# Patient Record
Sex: Male | Born: 1970 | Race: White | Hispanic: No | Marital: Married
Health system: Southern US, Community
[De-identification: ages and names within clinical notes are randomized; demographics above are authoritative.]

---

## 2007-09-19 ENCOUNTER — Inpatient Hospital Stay (HOSPITAL_COMMUNITY): Admission: RE | Admit: 2007-09-19 | Discharge: 2007-09-22 | Payer: Self-pay | Admitting: Orthopedic Surgery

## 2007-09-19 IMAGING — CR DG OS CALCIS 2+V*R*
3 series · 3 of 3 positions shown · non-contrast
Comparison: Spot views from plate and screw placement [DATE].

CLINICAL DATA: Calcaneus fracture.  Status post fixation.

RIGHT OS CALCIS - 2+ VIEW

[foot lat]
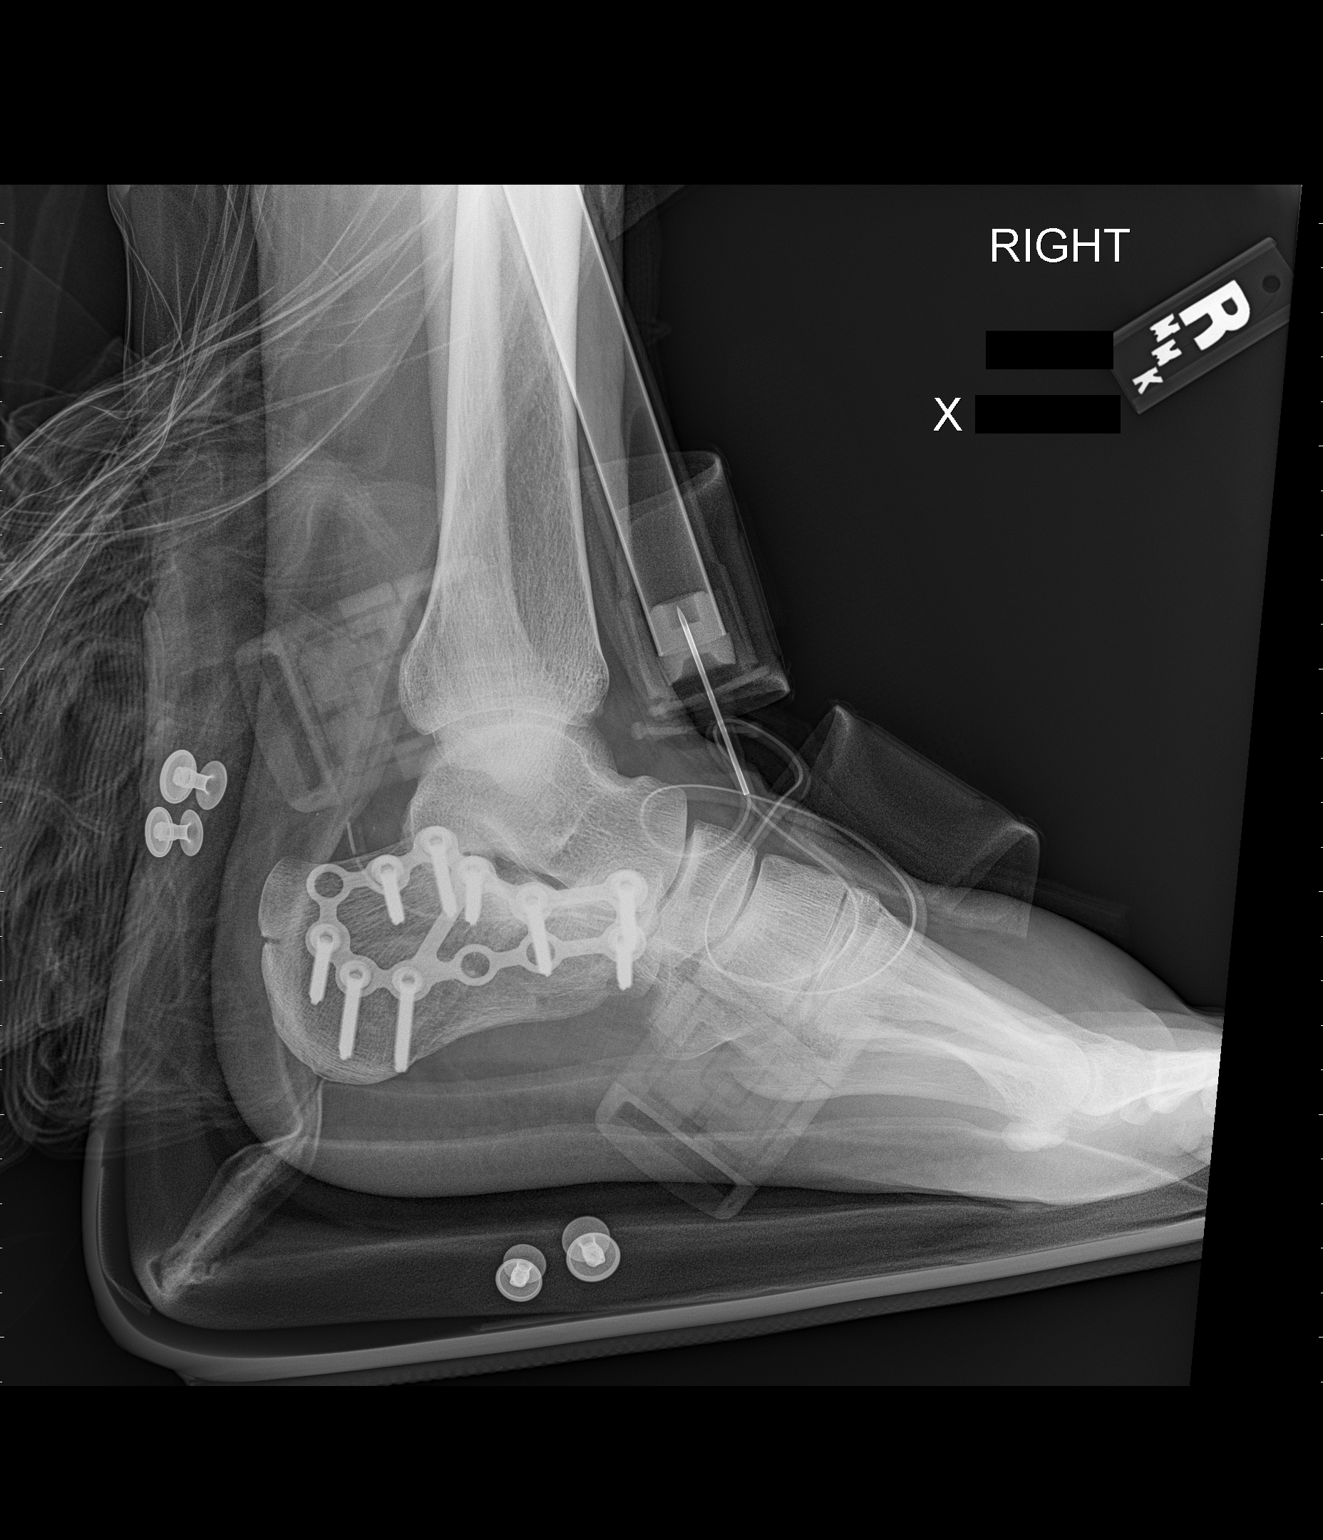

[AP (1 of 2)]
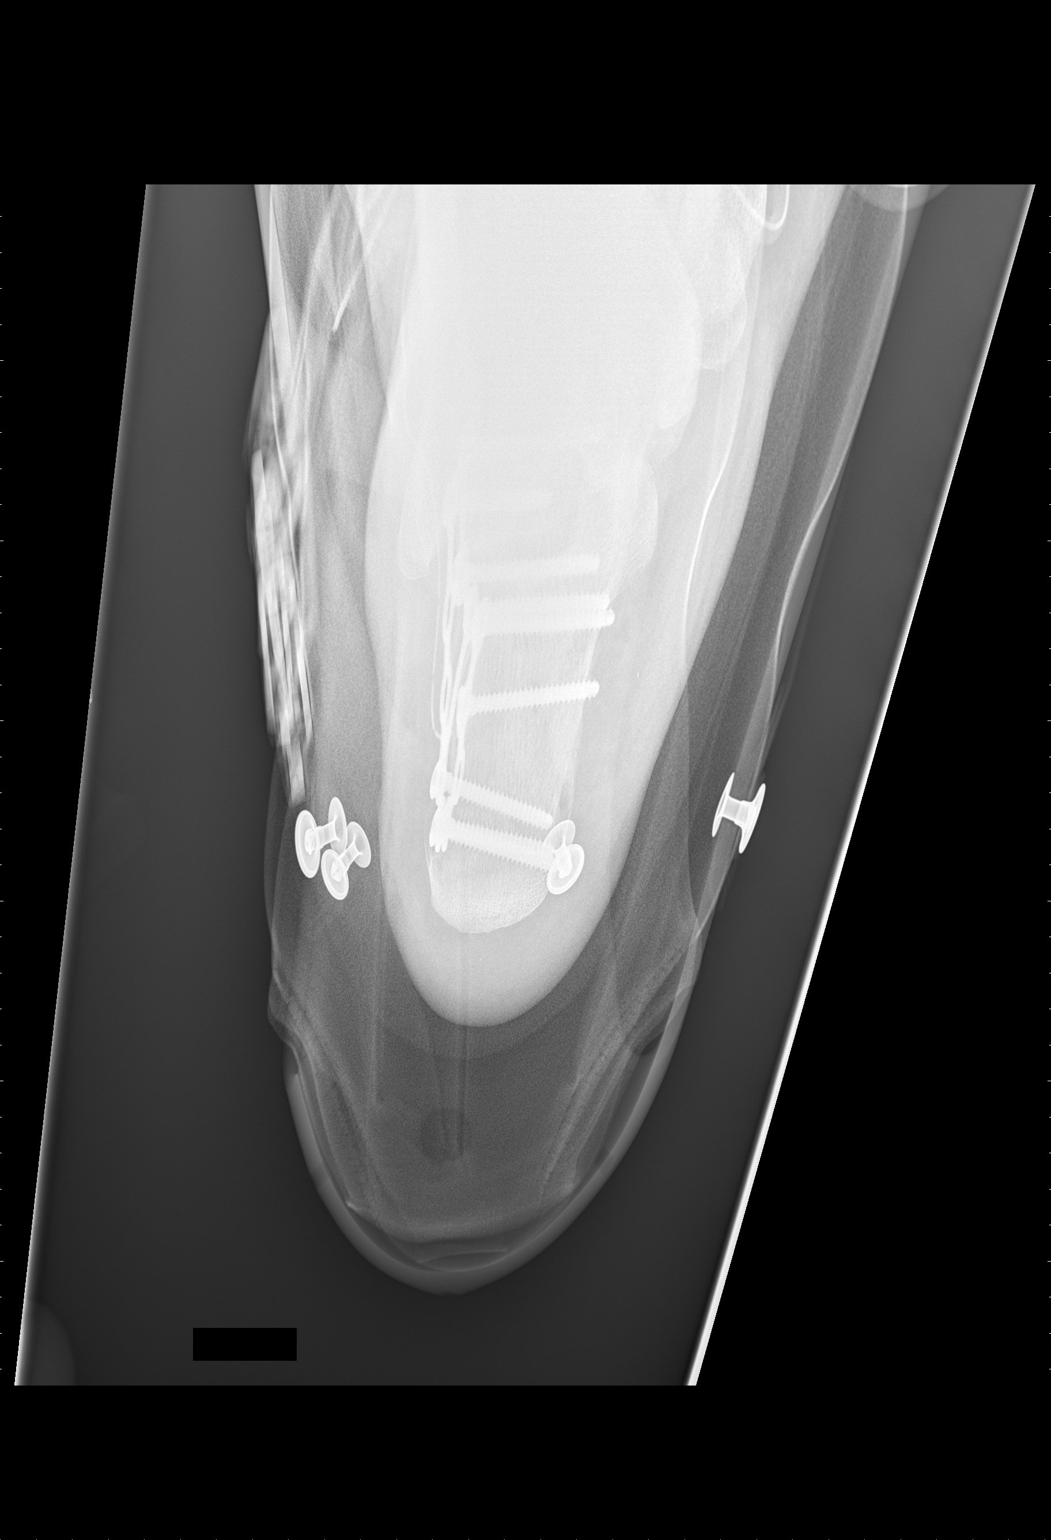

[AP (2 of 2)]
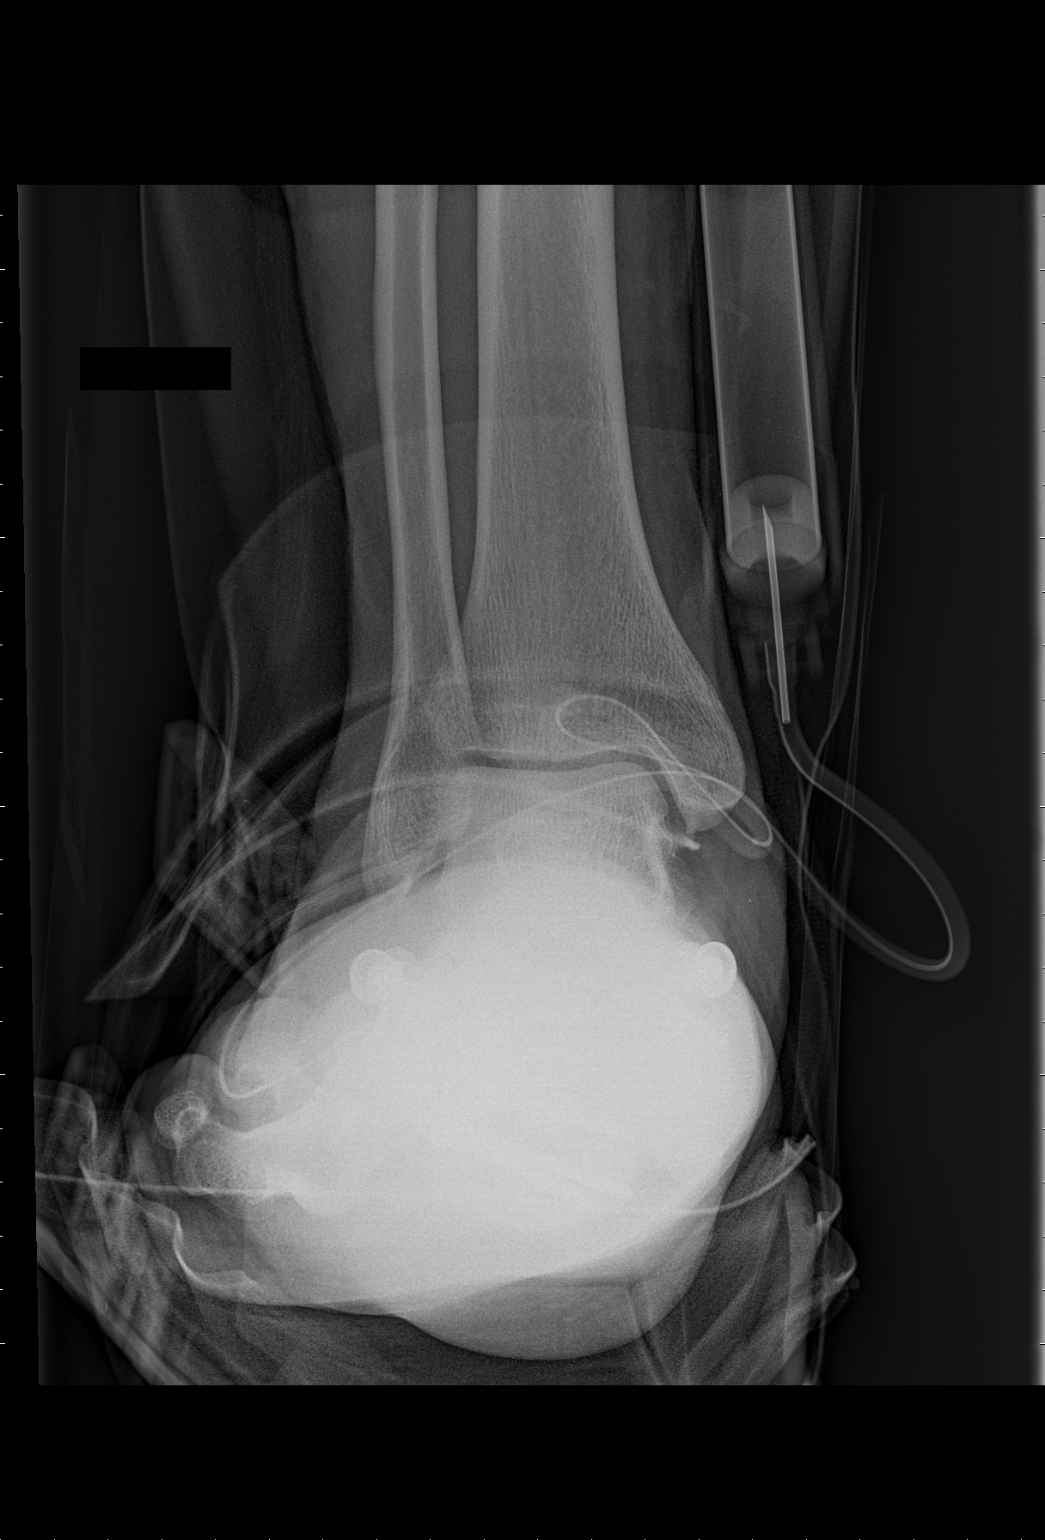

[3 of 3 positions shown; findings below may reference images not displayed]

FINDINGS: Lateral plate and screws are seen fixing a calcaneus
fracture.  No hardware complication is identified and position and
alignment appear near anatomical.  No new abnormality is seen.
IMPRESSION: Status post ORIF of calcaneus fracture.  No complicating features.

## 2008-08-02 ENCOUNTER — Encounter: Admission: RE | Admit: 2008-08-02 | Discharge: 2008-08-02 | Payer: Self-pay | Admitting: Orthopedic Surgery

## 2008-12-13 ENCOUNTER — Encounter: Admission: RE | Admit: 2008-12-13 | Discharge: 2008-12-13 | Payer: Self-pay | Admitting: Orthopedic Surgery

## 2009-04-17 ENCOUNTER — Encounter: Admission: RE | Admit: 2009-04-17 | Discharge: 2009-04-17 | Payer: Self-pay | Admitting: Orthopedic Surgery

## 2009-10-21 ENCOUNTER — Encounter: Admission: RE | Admit: 2009-10-21 | Discharge: 2009-10-21 | Payer: Self-pay | Admitting: Orthopedic Surgery

## 2010-09-15 NOTE — Discharge Summary (Signed)
NAME:  Dylan Cruz, Dylan Cruz NO.:  [id number]   MEDICAL RECORD NO.:  [id number]          PATIENT TYPE:  INP   LOCATION:  5015                         FACILITY:  [facility]   PHYSICIAN:  Doralee Albino. Carola Frost, M.D. DATE OF BIRTH:  03-03-1971   DATE OF ADMISSION:  09/19/2007  DATE OF DISCHARGE:  09/22/2007                               DISCHARGE SUMMARY   DISCHARGE DIAGNOSIS:  Right interarticular calcaneus fracture.   ADDITIONAL DIAGNOSIS:  Ulcerative colitis.   PROCEDURES PERFORMED:  On Sep 19, 2007:  Open reduction internal  fixation of right calcaneus.   BRIEF HISTORY AND HOSPITAL COURSE:  Dylan Cruz is a very pleasant 40-year-  old Caucasian male who fell off of a ladder while at work on Sep 06, 2007.  The patient was initially seen at [facility] and was  diagnosed with a comminuted right calcaneus fracture.  Given the  severity and complexity of the fracture, Dylan Cruz was referred to an  orthopedic trauma specialist, Dr. Carola Frost, for definitive management.  Prior to surgery, Dylan Cruz had a CT scan done on an outpatient basis,  which demonstrated intraarticular joint depressed-type fracture of the  right calcaneus.  Dylan Cruz was informed of the risks and benefits  associated with surgical intervention and elected to proceed with  surgical fixation of his right calcaneus fracture.   Dylan Cruz was brought to the operating room on Sep 19, 2007, where he  underwent open reduction internal fixation of his right calcaneus and he  tolerated the procedure well without any complications.  After the  procedure, the patient was placed in a night splint to maintain a  neutral position of his right ankle.  In addition, Dylan Cruz also had a  drain placed in the surgical wound to assist with any fluid collection.  Again, Dylan Cruz tolerated the procedure exceptionally well and his  total hospital stay was 3 days in length.  Dylan Cruz was admitted to the  orthopedic floor for pain  control and observation after his surgical  procedure.  The patient had pain that was well controlled with use of a  PCA, muscle relaxants, and p.o. pain medication.  He was up with therapy  on postoperative day 1 and tolerated therapy well, but continued to have  pain that required IV medications to obtain relief.  This persisted into  postoperative day #2, but the patient endured gradual decreases in pain  throughout the day on postoperative day 2.  The patient was more  ambulatory with therapy and on his own on postoperative day #2.  Therefore, on postoperative day #3, the patient was deemed stable from a  pain perspective, as well as with stable vital signs and a stable  physical exam to be discharged to home.  Of note, on postoperative day  #2, the patient's surgical dressing was taken down and the wound was  examined, as well as discontinuing the drain.  The surgical wound was  then re-dressed with adaptic, gauze, Kerlix, and an Ace wrap, and was  placed back into the night splint in a  neutral position.   Dylan Cruz hospital stay was relatively uneventful and was discharged  to home postoperative day #3.   DISCHARGE MEDICINES:  1. Percocet 10/325 one to two every 4 to 6 hours as needed for pain.  2. Oxycodone 15 mg 1/2 tab every 6 hours between Percocet doses for      breakthrough pain.  3. Robaxin 500 mg 1 to 2 every 6 hours as needed.  4. Lovenox 40 mg 1 subcutaneous injection daily.  5. Hydroxyzine 25 mg 1 p.o. q.6h as needed for itching.   The patient may also resume home medications as follows.  1. Lialda 1.5 mg 2 p.o. q. a.m.  2. Prilosec over-the-counter p.o. p.r.n.   Dylan Cruz will stop taking his hydrocodone 5/500 as he has been given  prescriptions for other narcotic medications.   DISCHARGE PHYSICAL EXAM:  The patient is doing exceptionally well.  Pain  is much more controlled, and he had a more restful night.  The patient  did receive good relief with a schedule  of Robaxin, in addition to  narcotic medication.  He is tolerating p.o. and no difficulty voiding.  He stated that he was up and about yesterday with PT and on his own with  the use of a rolling walker while maintaining non weightbearing on his  right lower extremity and tolerating this well, but did have some aching  and throbbing due to gravitational effects on his right lower extremity.  The patient reports no nausea, vomiting, or diarrhea.  No fevers or  chills are noted.  The patient does not have any chest pain, shortness  of breath, or abdominal pain.  No numbness or tingling reported and the  patient does report that he would like to go home today.  VITAL SIGNS:  Temperature 98.1, heart rate 98, respirations 18, BP  134/89, O2 saturation 94% on room air.  GENERAL:  The patient is awake and alert, in no acute distress.  He is  resting comfortably in bed, eating breakfast upon examination.  LUNGS:  Clear to auscultation bilaterally.  CARDIAC:  Regular rate and rhythm.  ABDOMEN:  Soft, nontender, nondistended.  Positive bowel sounds.  RIGHT LOWER EXTREMITY:  Splint and dressing are clean, dry, and intact.  The ankle does appear to be in a neutral position.  Distal sensory  function along the deep peroneal nerve, superficial peroneal nerve,  tibial nerve, sural nerve is intact.  Motor function of EHO and FHO are  also intact.  There is continued edema of the right foot, but there is  good color and temperature.   Therefore, based on physical exam and vital signs, the patient is stable  for discharge to home.   LABS:  Most recent labs were obtained on May 21, and demonstrated a  white blood cell count of 9.0, hemoglobin 12.0, hematocrit 33.6,  platelets 382,000.  Basic metabolic panel, sodium 138, potassium 4.1,  chloride 99, CO2 30, BUN 7, creatinine 1.01, glucose 107, calcium 9.5.  Postoperative x-rays demonstrate near-anatomical alignment of right  calcaneus fracture with intact  hardware.   DISCHARGE INSTRUCTIONS:  Dylan Cruz should do well from his injury given  the intraoperative findings and fracture fixation achieved.  He will  continue to be non weightbearing on his right lower extremity for the  next 6 to 8 weeks.  He is to use a rolling walker or crutches to  ambulate while maintaining his weightbearing restrictions.  Dylan Cruz  will remain in his  night splint for the next 2 weeks with the exception  of daily dressing changes.  Once the surgical wound is stabilized, we  anticipate initiation of unrestricted active motion and gentle passive  motion of his right ankle.  We will make this determination upon the  patient's first postoperative visit, which will be in 10 days.   Dylan Cruz will remain on Lovenox for DVT prophylaxis for 10 more days.  Once he completes this course, he will require no further DVT  prophylaxis.  Dylan Cruz can participate in activities as tolerated while  maintaining weightbearing restrictions, and again we will see him back  in 10 days for a followup visit, at which time we will assess his  operative wound and obtain a series of x-rays to evaluate fracture  alignment and healing.   With regard to wound care, Dylan Cruz should have daily dressing changes.  I have provided Dylan Cruz with a wound care sheet, which he should refer  to for proper management of his wound.  He can continue to dress the  wounds with adaptic, gauze, Kerlix, and Ace wrap if there continues to  be drainage.  Once the drainage stops, he may leave the wound open to  air, but may want to keep it covered secondary to friction and pressure  on the night splint.  At no time should Mr. Quezada apply any lotions,  ointments, or solutions to the operative wound, as this may be  detrimental  to the healing soft tissue.  Once the wound is completely dry for 36  hours, he may then wash the wound with soap and water only.  Again, Mr.  Sahagun will follow up in the office in  10 days.  Should he have any  questions prior to that, he is to call the office at [phone].      Mearl Latin, PA      Doralee Albino. Carola Frost, M.D.  Electronically Signed    KWP/MEDQ  D:  09/22/2007  T:  09/22/2007  Job:  [id number]

## 2010-09-15 NOTE — Op Note (Signed)
NAME:  BERTRAM, HADDIX NO.:  [id number]   MEDICAL RECORD NO.:  [id number]          PATIENT TYPE:  INP   LOCATION:  2550                         FACILITY:  [facility]   PHYSICIAN:  Doralee Albino. Carola Frost, M.D. DATE OF BIRTH:  09-29-1970   DATE OF PROCEDURE:  09/19/2007  DATE OF DISCHARGE:                               OPERATIVE REPORT   PREOPERATIVE DIAGNOSIS:  Right intra-articular calcaneus fracture.   POSTOPERATIVE DIAGNOSIS:  Right intra-articular calcaneus fracture.   PROCEDURE:  Open reduction and internal fixation of right calcaneus.   SURGEON:  Doralee Albino. Carola Frost, MD   ASSISTANT:  Mearl Latin, PA   ANESTHESIA:  General, supplemented with popliteal block.   ANESTHESIOLOGIST:  Quita Skye. Krista Blue, MD   ESTIMATED BLOOD LOSS:  Scant.   TOTAL TOURNIQUET TIME:  1 hour 59 minutes.   DRAINS:  One.   DISPOSITION:  To PACU.   CONDITION:  Stable.   INDICATIONS FOR PROCEDURE:  Keagon Glascoe is a 40 year old male who fell  off a ladder sustaining an intra-articular joint depression-type  fracture of the right calcaneus.  This was confirmed by CT scan with  loss of proper alignment.  We discussed preoperative risks and benefits  of the surgery including the possibility of infection, nerve injury,  vessel injury, need for further surgery, DVT, PE, wound breakdown  requiring reconstructive surgery, deep bone infection, subtalar  arthritis, need for further procedures, and others.  After full  discussion, wished to  proceed.   DESCRIPTION OF PROCEDURE:  Mr. Husted was administered Ancef  preoperatively, taken to the operating room where general anesthesia was  induced.  His right lower extremity was prepped and draped after  positioning him in so-called lazy lateral. position.  All prominences  were padded appropriately.  We elevated the leg and applied an Esmarch  bandage and then inflated tourniquet, 300 mmHg.  We made a standard  lateral extensile approach elevating the  flap from the subperiosteal  layer being careful to avoid any injury to the peroneal tendons.  We  exposed the calcaneus and subtalar joint.  The fracture line could be  clearly palpated over the dorsal aspect of the tuberosity and also the  joint distracted and evaluated.  We were able to then elevate the  lateral aspect of the subtalar articular surface and rotated up and into  proper position, securing it provisionally with a K-wire.  Then, we  could correct the hindfoot varus and lateral wall blowout with digital  manipulation and pressure.  We selected the large calcaneal plate and  then checked our reduction and plate size.  We then placed a subtalar  lag screw, securing it into the medial sustentacular block.  We then  placed the screw opposite the diagonal bar along the plate and secured  this as well.  Again, we applied valgus forces and placed screws into  the tuberosity in the anterior process to maintain our reduction and  alignment.  We placed one additional lag screw into the tuberosity along  the dorsal surface just underneath the subtalar joint and then we  placed  the remaining subtalar screw.  We carefully evaluated these under x-ray  and as we had brought the plate and opposed it to the bone, we did need  to change out a few screws for shorter ones.  Final lateral Broden's AP  foot and Tiburcio Pea' views showed appropriate alignment of hardware  placement and length with good reduction of the subtalar joint,  elimination of lateral wall protrusion and correction into anatomic  alignment.  We irrigated thoroughly, placed a deep drain along the flap  and then closed in standard layered fashion using 2-0 Vicryl, 3-0  Vicryl, and 3-0 nylon.  A sterile, gently compressive dressing was  applied and then a night splint.  The patient was awakened from  anesthesia and transported to the PACU in stable condition after  deflation of the tourniquet at 1 hour and 59 minutes.  Mr. Renae Fickle  assisted  throughout the case, retracting for exposure, maintaining reduction, and  assisting with instrumentation and combined closure.   PROGNOSIS:  Mr. Bailon should do well following his injury if he can  maintain compliance with his weightbearing restrictions.  We will plan  to see him back in 10 days for evaluation of his wound.  If it appears  to be stable at that time, we will discontinue his motion restrictions  and begin him on unrestricted active motion and gentle passive motion.  He will delay weightbearing until 6-8 weeks, based upon his clinical  examination and x-ray followup.  He will be on DVT prophylaxis with  Lovenox with plans to go home with a 10-day course after discharge.  He  will refrain from tobacco products and antiinflammatories.      Doralee Albino. Carola Frost, M.D.  Electronically Signed     MHH/MEDQ  D:  09/19/2007  T:  09/20/2007  Job:  [id number]

## 2011-01-27 LAB — DIFFERENTIAL
Basophils Absolute: 0
Basophils Absolute: 0
Basophils Relative: 1
Eosinophils Absolute: 0.2
Eosinophils Relative: 2
Lymphocytes Relative: 11 — ABNORMAL LOW
Lymphocytes Relative: 20
Lymphs Abs: 1.8
Monocytes Absolute: 1
Neutro Abs: 6.2
Neutrophils Relative %: 69

## 2011-01-27 LAB — CBC
HCT: 35 — ABNORMAL LOW
Hemoglobin: 12 — ABNORMAL LOW
Hemoglobin: 12.3 — ABNORMAL LOW
MCHC: 35.6
RBC: 3.81 — ABNORMAL LOW
RBC: 3.97 — ABNORMAL LOW
RDW: 12.6

## 2011-01-27 LAB — BASIC METABOLIC PANEL
CO2: 30
CO2: 30
Calcium: 9.5
Chloride: 99
Creatinine, Ser: 1.01
GFR calc Af Amer: >60
GFR calc Af Amer: >60
GFR calc non Af Amer: >60
Sodium: 138

## 2022-10-26 ENCOUNTER — Telehealth: Payer: Self-pay | Admitting: *Deleted

## 2022-10-26 NOTE — Telephone Encounter (Signed)
Opened in error
